# Patient Record
Sex: Male | Born: 2000 | Race: Black or African American | Hispanic: No | Marital: Single | Smoking: Never smoker
Health system: Southern US, Community
[De-identification: ages and names within clinical notes are randomized; demographics above are authoritative.]

## PROBLEM LIST (undated history)

## (undated) DIAGNOSIS — F909 Attention-deficit hyperactivity disorder, unspecified type: Secondary | ICD-10-CM

---
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
## 2020-03-13 ENCOUNTER — Other Ambulatory Visit: Payer: BC Managed Care – PPO

## 2020-03-13 ENCOUNTER — Other Ambulatory Visit: Payer: Self-pay | Admitting: *Deleted

## 2020-03-13 DIAGNOSIS — Z20822 Contact with and (suspected) exposure to covid-19: Secondary | ICD-10-CM

## 2020-03-14 LAB — SARS-COV-2, NAA 2 DAY TAT

## 2020-03-14 LAB — NOVEL CORONAVIRUS, NAA: SARS-CoV-2, NAA: NOT DETECTED

## 2020-07-03 ENCOUNTER — Other Ambulatory Visit: Payer: BC Managed Care – PPO

## 2020-07-03 DIAGNOSIS — Z20822 Contact with and (suspected) exposure to covid-19: Secondary | ICD-10-CM

## 2020-07-04 LAB — NOVEL CORONAVIRUS, NAA: SARS-CoV-2, NAA: NOT DETECTED

## 2020-07-04 LAB — SARS-COV-2, NAA 2 DAY TAT
